# Patient Record
Sex: Female | Born: 1935 | Race: White | Hispanic: No | Marital: Married | Smoking: Never smoker
Health system: Southern US, Community
[De-identification: ages and names within clinical notes are randomized; demographics above are authoritative.]

## PROBLEM LIST (undated history)

## (undated) DIAGNOSIS — I10 Essential (primary) hypertension: Secondary | ICD-10-CM

## (undated) DIAGNOSIS — D649 Anemia, unspecified: Secondary | ICD-10-CM

## (undated) DIAGNOSIS — G7000 Myasthenia gravis without (acute) exacerbation: Secondary | ICD-10-CM

---

## 2007-12-13 ENCOUNTER — Emergency Department (HOSPITAL_BASED_OUTPATIENT_CLINIC_OR_DEPARTMENT_OTHER): Admission: EM | Admit: 2007-12-13 | Discharge: 2007-12-13 | Payer: Self-pay | Admitting: Emergency Medicine

## 2007-12-22 ENCOUNTER — Emergency Department (HOSPITAL_BASED_OUTPATIENT_CLINIC_OR_DEPARTMENT_OTHER): Admission: EM | Admit: 2007-12-22 | Discharge: 2007-12-22 | Payer: Self-pay | Admitting: Emergency Medicine

## 2008-01-24 ENCOUNTER — Emergency Department (HOSPITAL_BASED_OUTPATIENT_CLINIC_OR_DEPARTMENT_OTHER): Admission: EM | Admit: 2008-01-24 | Discharge: 2008-01-25 | Payer: Self-pay | Admitting: Emergency Medicine

## 2009-11-29 IMAGING — CT CT HEAD W/O CM
1 series · 16 of 30 positions shown, 20 images · non-contrast
Comparison: None

CLINICAL DATA: Dizziness.

CT HEAD WITHOUT CONTRAST
TECHNIQUE: Contiguous axial images were obtained from the base of
the skull through the vertex without contrast.

[Series 2: head 4.8 h37s · axial · 0.41mm/px · z∈[+1240,+1392]mm · 16 of 36 slices shown, 20 images]
[im 2/36  brain]
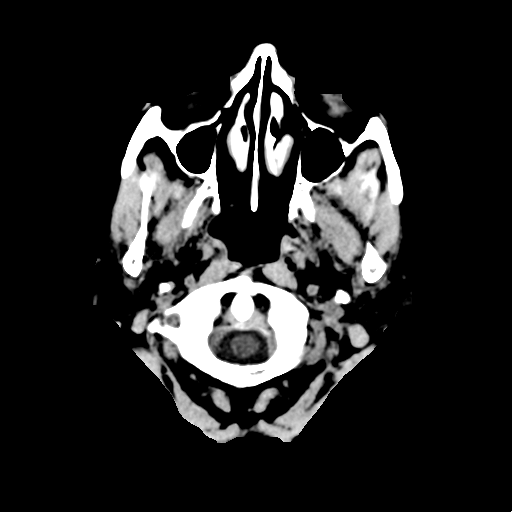
[im 2/36  bone]
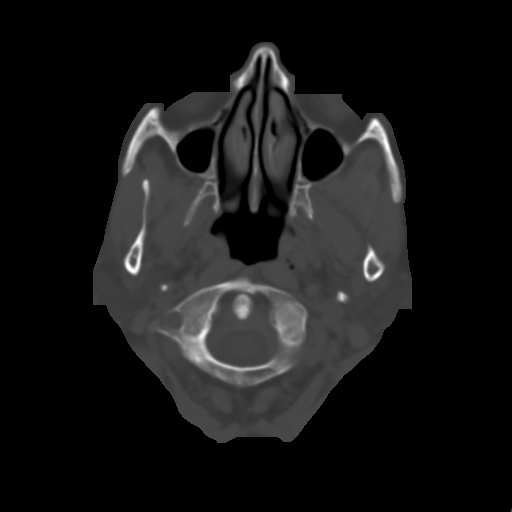
[im 4/36  brain]
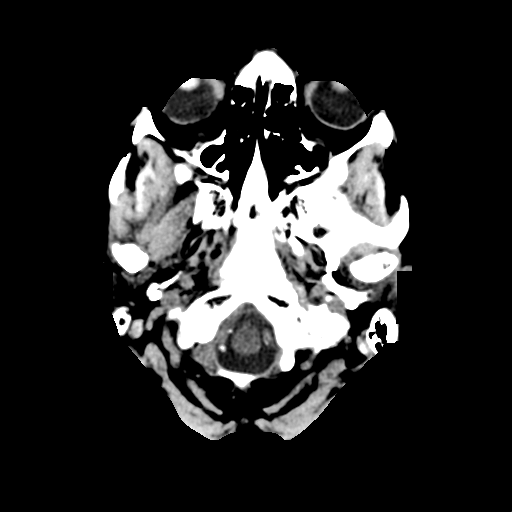
[im 7/36  brain]
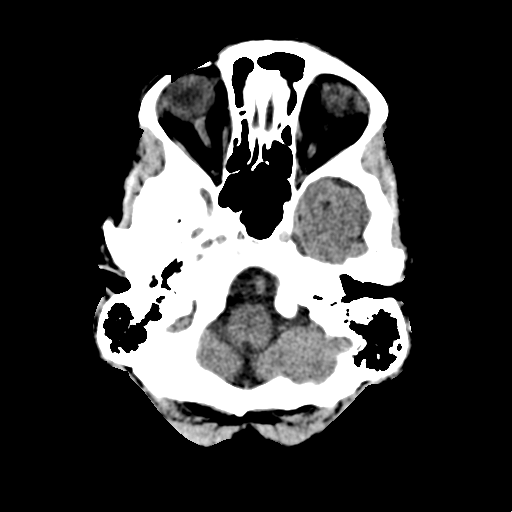
[im 9/36  brain]
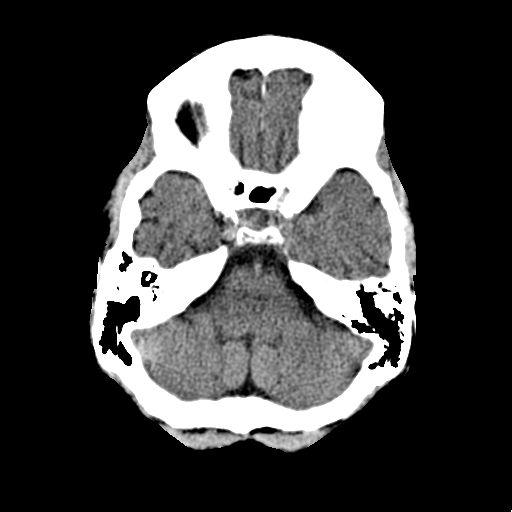
[im 10/36  brain]
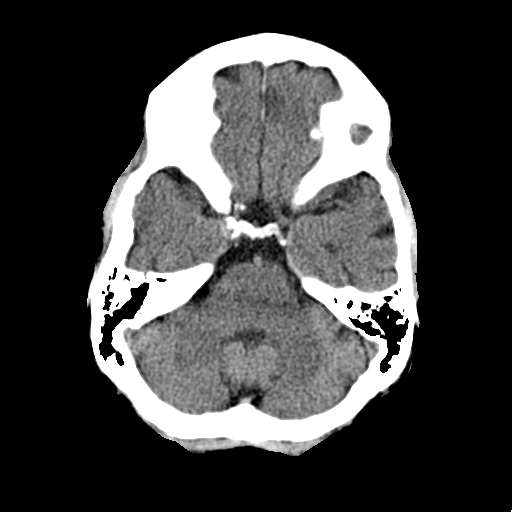
[im 10/36  bone]
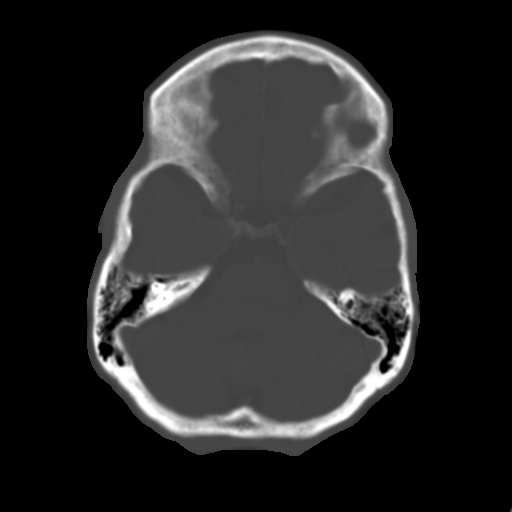
[im 13/36  brain]
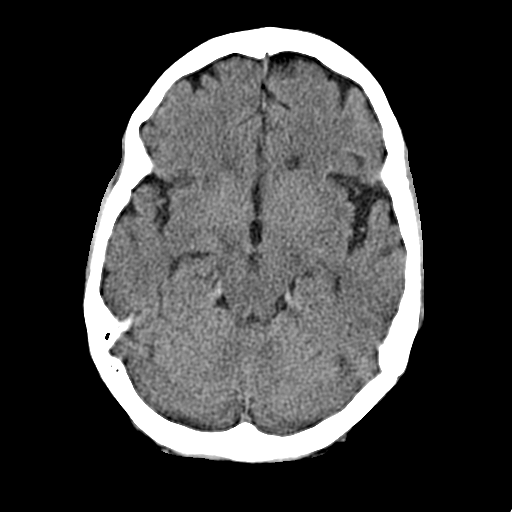
[im 15/36  brain]
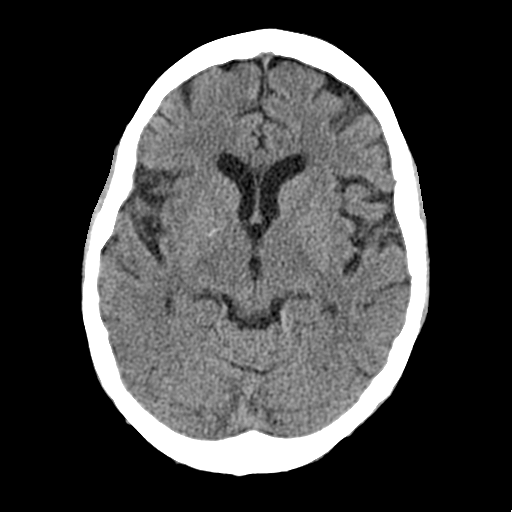
[im 17/36  brain]
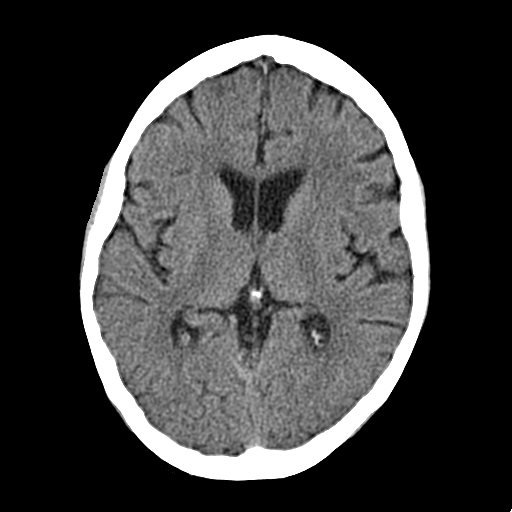
[im 19/36  brain]
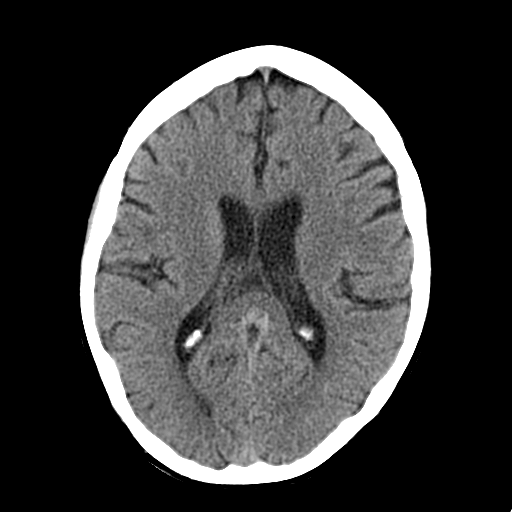
[im 19/36  bone]
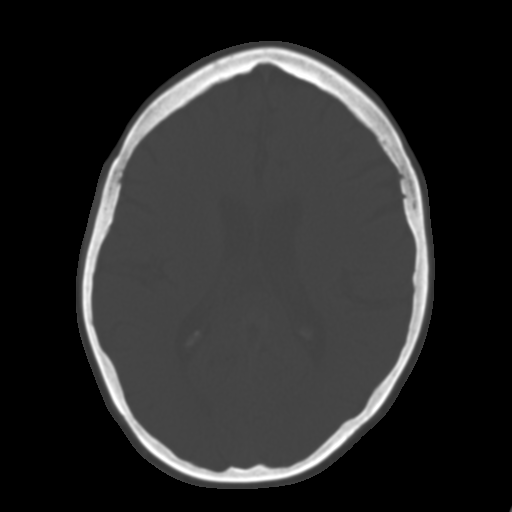
[im 21/36  brain]
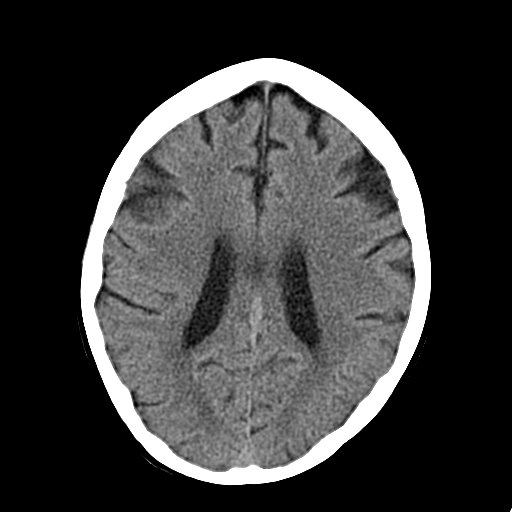
[im 23/36  brain]
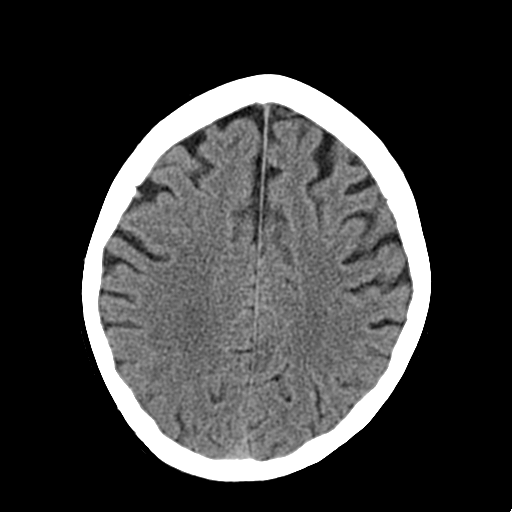
[im 26/36  brain]
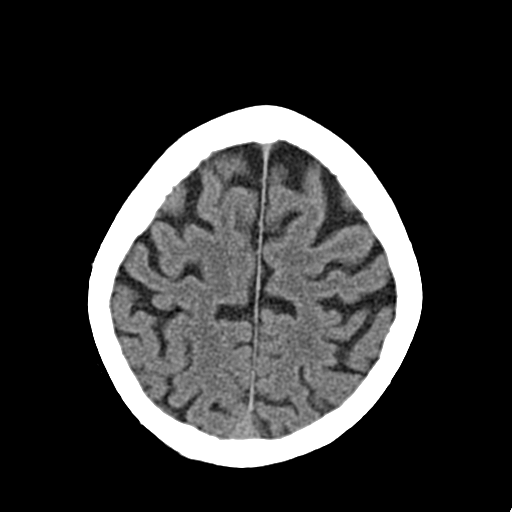
[im 27/36  brain]
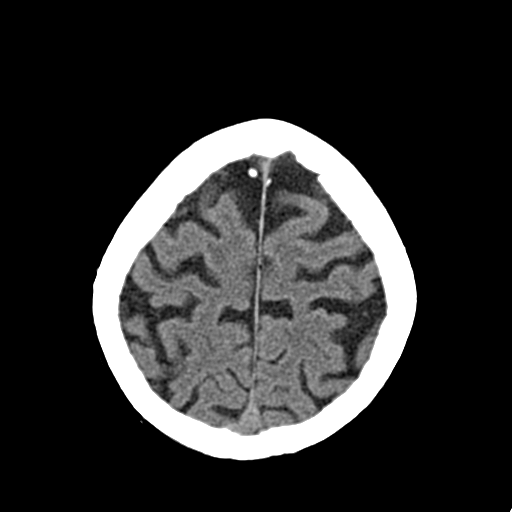
[im 27/36  bone]
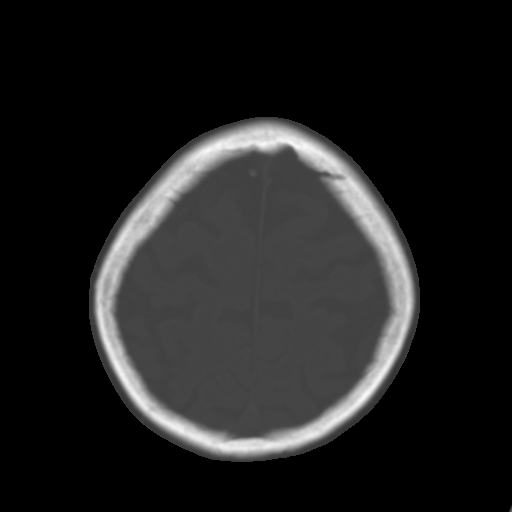
[im 29/36  brain]
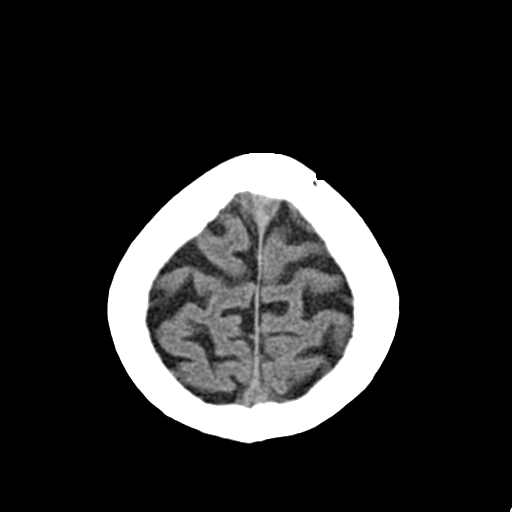
[im 32/36  brain]
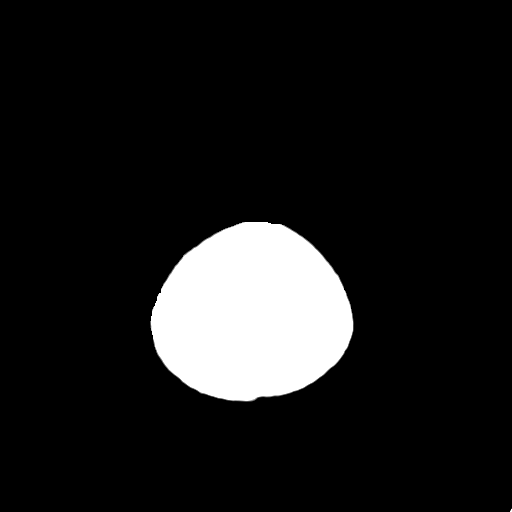
[im 34/36  brain]
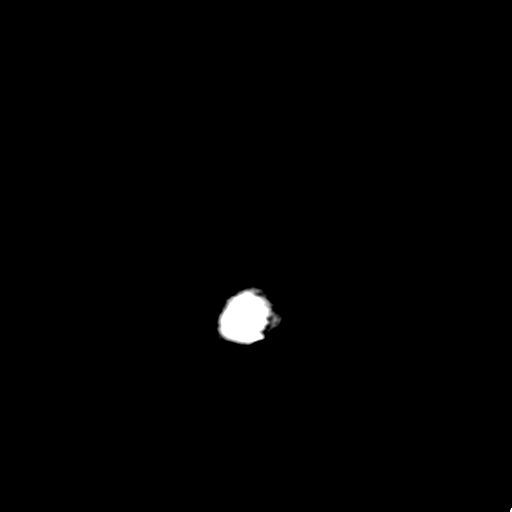

[16 of 30 positions shown; findings below may reference images not displayed]

FINDINGS: There is mild cerebral atrophy.  No evidence for acute
hemorrhage, mass lesion, midline shift, hydrocephalus or large
infarct.  No acute bony abnormalities.  The visualized paranasal
sinuses are clear. Subtle low density along the inferior aspect of
the right basal ganglia could represent an old insult and there may
be changes of chronic small vessel ischemic changes in the right
frontal subcortical white matter.
IMPRESSION: No acute intracranial abnormality.

Mild cerebral atrophy.

## 2011-01-30 LAB — CBC
Hemoglobin: 13.5
MCHC: 34.3
MCV: 95.8
RDW: 12.2

## 2011-01-30 LAB — GLUCOSE, CAPILLARY

## 2011-01-30 LAB — BASIC METABOLIC PANEL
BUN: 13
Chloride: 102
GFR calc Af Amer: >60
GFR calc non Af Amer: >60
Potassium: 3.6
Sodium: 134 — ABNORMAL LOW

## 2011-01-30 LAB — POCT CARDIAC MARKERS: Troponin i, poc: <0.05

## 2011-02-02 LAB — COMPREHENSIVE METABOLIC PANEL
ALT: 24
Alkaline Phosphatase: 57
BUN: 8
Chloride: 96
Glucose, Bld: 119 — ABNORMAL HIGH
Potassium: 3.9
Sodium: 131 — ABNORMAL LOW
Total Bilirubin: 0.6
Total Protein: 7

## 2011-02-02 LAB — URINALYSIS, ROUTINE W REFLEX MICROSCOPIC
Glucose, UA: NEGATIVE
Nitrite: NEGATIVE
Specific Gravity, Urine: 1.004 — ABNORMAL LOW
pH: 7.5

## 2011-02-02 LAB — DIFFERENTIAL
Basophils Absolute: 0
Basophils Relative: 0
Eosinophils Absolute: 0.1
Monocytes Absolute: 0.7
Monocytes Relative: 9
Neutro Abs: 5.1
Neutrophils Relative %: 65

## 2011-02-02 LAB — CBC
HCT: 39.3
Hemoglobin: 13.3
RBC: 4.11
WBC: 7.8

## 2011-02-02 LAB — APTT: aPTT: 40 — ABNORMAL HIGH

## 2011-02-02 LAB — POCT CARDIAC MARKERS: Troponin i, poc: <0.05

## 2017-12-29 ENCOUNTER — Emergency Department (HOSPITAL_BASED_OUTPATIENT_CLINIC_OR_DEPARTMENT_OTHER): Payer: Medicare Other

## 2017-12-29 ENCOUNTER — Other Ambulatory Visit: Payer: Self-pay

## 2017-12-29 ENCOUNTER — Encounter (HOSPITAL_BASED_OUTPATIENT_CLINIC_OR_DEPARTMENT_OTHER): Payer: Self-pay | Admitting: *Deleted

## 2017-12-29 ENCOUNTER — Emergency Department (HOSPITAL_BASED_OUTPATIENT_CLINIC_OR_DEPARTMENT_OTHER)
Admission: EM | Admit: 2017-12-29 | Discharge: 2017-12-29 | Disposition: A | Discharge status: Home / Self Care | Payer: Medicare Other | Attending: Emergency Medicine | Admitting: Emergency Medicine

## 2017-12-29 DIAGNOSIS — I10 Essential (primary) hypertension: Secondary | ICD-10-CM | POA: Diagnosis not present

## 2017-12-29 DIAGNOSIS — Z7982 Long term (current) use of aspirin: Secondary | ICD-10-CM | POA: Diagnosis not present

## 2017-12-29 DIAGNOSIS — Z79899 Other long term (current) drug therapy: Secondary | ICD-10-CM | POA: Diagnosis not present

## 2017-12-29 DIAGNOSIS — R509 Fever, unspecified: Secondary | ICD-10-CM | POA: Insufficient documentation

## 2017-12-29 DIAGNOSIS — R1030 Lower abdominal pain, unspecified: Secondary | ICD-10-CM | POA: Diagnosis present

## 2017-12-29 DIAGNOSIS — R140 Abdominal distension (gaseous): Secondary | ICD-10-CM | POA: Diagnosis not present

## 2017-12-29 DIAGNOSIS — K5792 Diverticulitis of intestine, part unspecified, without perforation or abscess without bleeding: Secondary | ICD-10-CM | POA: Diagnosis not present

## 2017-12-29 HISTORY — DX: Anemia, unspecified: D64.9

## 2017-12-29 HISTORY — DX: Essential (primary) hypertension: I10

## 2017-12-29 HISTORY — DX: Myasthenia gravis without (acute) exacerbation: G70.00

## 2017-12-29 LAB — CBC WITH DIFFERENTIAL/PLATELET
BASOS ABS: 0 10*3/uL (ref 0.0–0.1)
BASOS PCT: 0 %
Eosinophils Absolute: 0 10*3/uL (ref 0.0–0.7)
Eosinophils Relative: 0 %
HCT: 36.9 % (ref 36.0–46.0)
HEMOGLOBIN: 12.3 g/dL (ref 12.0–15.0)
LYMPHS PCT: 5 %
Lymphs Abs: 0.6 10*3/uL — ABNORMAL LOW (ref 0.7–4.0)
MCH: 30.1 pg (ref 26.0–34.0)
MCHC: 33.3 g/dL (ref 30.0–36.0)
MCV: 90.4 fL (ref 78.0–100.0)
Monocytes Absolute: 0.8 10*3/uL (ref 0.1–1.0)
Monocytes Relative: 8 %
NEUTROS PCT: 87 %
Neutro Abs: 9.3 10*3/uL — ABNORMAL HIGH (ref 1.7–7.7)
Platelets: 337 10*3/uL (ref 150–400)
RBC: 4.08 MIL/uL (ref 3.87–5.11)
RDW: 13.6 % (ref 11.5–15.5)
WBC: 10.8 10*3/uL — ABNORMAL HIGH (ref 4.0–10.5)

## 2017-12-29 LAB — COMPREHENSIVE METABOLIC PANEL
ALBUMIN: 3.4 g/dL — AB (ref 3.5–5.0)
ALK PHOS: 72 U/L (ref 38–126)
ALT: 16 U/L (ref 0–44)
AST: 29 U/L (ref 15–41)
Anion gap: 10 (ref 5–15)
BUN: 20 mg/dL (ref 8–23)
CALCIUM: 9 mg/dL (ref 8.9–10.3)
CO2: 25 mmol/L (ref 22–32)
CREATININE: 0.91 mg/dL (ref 0.44–1.00)
Chloride: 95 mmol/L — ABNORMAL LOW (ref 98–111)
GFR calc Af Amer: >60 mL/min (ref >60)
GFR calc non Af Amer: 54 mL/min — ABNORMAL LOW (ref >60)
GLUCOSE: 138 mg/dL — AB (ref 70–99)
POTASSIUM: 4.4 mmol/L (ref 3.5–5.1)
Sodium: 130 mmol/L — ABNORMAL LOW (ref 135–145)
TOTAL PROTEIN: 7.4 g/dL (ref 6.5–8.1)
Total Bilirubin: 0.6 mg/dL (ref 0.3–1.2)

## 2017-12-29 LAB — URINALYSIS, ROUTINE W REFLEX MICROSCOPIC
BILIRUBIN URINE: NEGATIVE
GLUCOSE, UA: NEGATIVE mg/dL
KETONES UR: NEGATIVE mg/dL
LEUKOCYTES UA: NEGATIVE
Nitrite: NEGATIVE
PH: 6 (ref 5.0–8.0)
Protein, ur: 30 mg/dL — AB
Specific Gravity, Urine: 1.015 (ref 1.005–1.030)

## 2017-12-29 LAB — URINALYSIS, MICROSCOPIC (REFLEX)

## 2017-12-29 LAB — LIPASE, BLOOD: Lipase: 31 U/L (ref 11–51)

## 2017-12-29 MED ORDER — ACETAMINOPHEN 500 MG PO TABS
1000.0000 mg | ORAL_TABLET | Freq: Once | ORAL | Status: AC
  Administered 2017-12-29: 1000 mg via ORAL
  Filled 2017-12-29: qty 2

## 2017-12-29 MED ORDER — AMOXICILLIN-POT CLAVULANATE 875-125 MG PO TABS: 1.0000 | ORAL_TABLET | Freq: Two times a day (BID) | ORAL | 0 refills | Status: AC

## 2017-12-29 MED ORDER — AMOXICILLIN-POT CLAVULANATE 875-125 MG PO TABS
1.0000 | ORAL_TABLET | Freq: Once | ORAL | Status: AC
  Administered 2017-12-29: 1 via ORAL
  Filled 2017-12-29: qty 1

## 2017-12-29 MED ORDER — SODIUM CHLORIDE 0.9 % IV BOLUS
1000.0000 mL | Freq: Once | INTRAVENOUS | Status: AC
  Administered 2017-12-29: 1000 mL via INTRAVENOUS

## 2017-12-29 NOTE — ED Triage Notes (Addendum)
Pt brought in by [facility] from [facility] c/o abd pain x 2 weeks , Dx UTI x 1 week ago on 2nd abx

## 2017-12-29 NOTE — Discharge Instructions (Addendum)
Follow up with your PCP.  Take your medications as prescribed.

## 2017-12-29 NOTE — ED Provider Notes (Signed)
[facility] EMERGENCY DEPARTMENT Provider Note   CSN: [id number] Arrival date & time: 12/29/17  1427     History   Chief Complaint Chief Complaint  Patient presents with  . Abdominal Pain    HPI Stacey Shepherd is a 84 y.o. female.  84 yo F with a chief complaint of abdominal pain.  This is a chronic issue for her.  She states that she had old age.  She is been having this pain off and on.  Denies any significant worsening.  She has been having diarrhea that has been chronic as well.  Normally has a couple loose bowel movements in the morning and then resolves.  She denies cough or congestion denies dysuria increased frequency or hesitancy denies flank pain.  She denies fevers or chills.  She was recently seen by her family physician for her diarrhea and was found to have a urinary tract infection.  She was started on bactrim and then transition to macrobid.  The history is provided by the patient.  Abdominal Pain   This is a chronic problem. The current episode started more than 1 week ago. The problem occurs constantly. The problem has not changed since onset.The pain is located in the generalized abdominal region. The quality of the pain is sharp and shooting. The pain is at a severity of 6/10. The pain is moderate. Associated symptoms include diarrhea. Pertinent negatives include fever, nausea, vomiting, dysuria, headaches, arthralgias and myalgias. Nothing aggravates the symptoms. Nothing relieves the symptoms.    Past Medical History:  Diagnosis Date  . Anemia   . Hypertension   . Myasthenia gravis (HCC)     There are no active problems to display for this patient.   History reviewed. No pertinent surgical history.   OB History   None      Home Medications    Prior to Admission medications   Medication Sig Start Date End Date Taking? Authorizing Provider  aspirin EC 81 MG tablet Take by mouth. 03/06/08  Yes [provider]  Cholecalciferol  (VITAMIN D-1000 MAX ST) 1000 units tablet Take by mouth. 02/11/08  Yes [provider]  diltiazem (CARDIZEM CD) 180 MG 24 hr capsule TAKE 1 CAPSULE BY MOUTH TWICE DAILY 11/08/17  Yes [provider]  lisinopril (PRINIVIL,ZESTRIL) 10 MG tablet Take by mouth. 09/21/17 06/16/20 Yes [provider]  potassium chloride SA (K-DUR,KLOR-CON) 20 MEQ tablet TAKE 1 TABLET BY MOUTH DAILY GENERIC EQUIVALENT FOR KLOR-CON 09/21/17  Yes [provider]  pyridostigmine (MESTINON) 60 MG tablet One half to one whole tablet every 6 hours as needed for double vision. 10/13/16  Yes [provider]  CARTIA XT 300 MG 24 hr capsule TK ONE C PO D. GEQ DILTIAZEM CD 10/25/17   [provider]  diltiazem (CARDIZEM CD) 180 MG 24 hr capsule TK ONE C PO BID 11/09/17   [provider]  Ferrous Sulfate 134 MG TABS Take by mouth.    [provider]  folic acid (FOLVITE) 400 MCG tablet Take by mouth.    [provider]  furosemide (LASIX) 20 MG tablet TK 1 T PO D 10/27/17   [provider]  nitrofurantoin, macrocrystal-monohydrate, (MACROBID) 100 MG capsule TAKE ONE CAPSULE BY MOUTH TWICE A DAY FOR 10 DAYS 12/22/17   [provider]  potassium chloride SA (K-DUR,KLOR-CON) 20 MEQ tablet TK 1 T PO D. GEF KLOR-CON 10/26/17   [provider]  sulfamethoxazole-trimethoprim (BACTRIM DS,SEPTRA DS) 800-160 MG tablet Take  1 tablet by mouth 2 (two) times daily. for 10 days 12/20/17   [provider]    Family History History reviewed. No pertinent family history.  Social History Social History   Tobacco Use  . Smoking status: Never Smoker  . Smokeless tobacco: Never Used  Substance Use Topics  . Alcohol use: Not Currently  . Drug use: Not Currently     Allergies   Ciprofloxacin   Review of Systems Review of Systems  Constitutional: Negative for chills and fever.  HENT: Negative for congestion and rhinorrhea.   Eyes:  Negative for redness and visual disturbance.  Respiratory: Negative for shortness of breath and wheezing.   Cardiovascular: Negative for chest pain and palpitations.  Gastrointestinal: Positive for abdominal distention, abdominal pain and diarrhea. Negative for nausea and vomiting.  Genitourinary: Negative for dysuria and urgency.  Musculoskeletal: Negative for arthralgias and myalgias.  Skin: Negative for pallor and wound.  Neurological: Negative for dizziness and headaches.     Physical Exam Updated Vital Signs BP (!) 158/83 (BP Location: Right Arm)   Pulse 88   Temp (!) 100.4 F (38 C) (Oral)   Resp 18   Ht 5\' 5"  (1.651 m)   Wt 47.6 kg   SpO2 97%   BMI 17.47 kg/m   Physical Exam  Constitutional: She is oriented to person, place, and time. She appears well-developed and well-nourished. No distress.  HENT:  Head: Normocephalic and atraumatic.  Eyes: Pupils are equal, round, and reactive to light. EOM are normal.  Neck: Normal range of motion. Neck supple.  Cardiovascular: Normal rate and regular rhythm. Exam reveals no gallop and no friction rub.  No murmur heard. Pulmonary/Chest: Effort normal. She has no wheezes. She has no rales.  Abdominal: Soft. She exhibits distension. She exhibits no mass. There is tenderness (diffuse lower). There is no guarding, no tenderness at McBurney's point and negative Murphy's sign.  Musculoskeletal: She exhibits no edema or tenderness.  Neurological: She is alert and oriented to person, place, and time.  Skin: Skin is warm and dry. She is not diaphoretic.  Psychiatric: She has a normal mood and affect. Her behavior is normal.  Nursing note and vitals reviewed.    ED Treatments / Results  Labs (all labs ordered are listed, but only abnormal results are displayed) Labs Reviewed  CBC WITH DIFFERENTIAL/PLATELET - Abnormal; Notable for the following components:      Result Value   WBC 10.8 (*)    Neutro Abs 9.3 (*)    Lymphs Abs 0.6 (*)     All other components within normal limits  COMPREHENSIVE METABOLIC PANEL - Abnormal; Notable for the following components:   Sodium 130 (*)    Chloride 95 (*)    Glucose, Bld 138 (*)    Albumin 3.4 (*)    GFR calc non Af Amer 54 (*)    All other components within normal limits  URINE CULTURE  GASTROINTESTINAL PANEL BY PCR, STOOL (REPLACES STOOL CULTURE)  LIPASE, BLOOD  URINALYSIS, ROUTINE W REFLEX MICROSCOPIC    EKG None  Radiology Dg Chest 2 View  Result Date: 12/29/2017 CLINICAL DATA:  84 year old female with a history abdominal pain EXAM: CHEST - 2 VIEW COMPARISON:  01/25/2008 FINDINGS: Cardiomediastinal silhouette unchanged in size and contour. No evidence of central vascular congestion. No pneumothorax or pleural effusion. Coarsened interstitial markings, similar to prior with no confluent airspace disease. Stigmata of emphysema, with increased retrosternal airspace, flattened hemidiaphragms, increased AP diameter, and hyperinflation on the  AP view. Since the prior plain film there has been placement of left chest wall cardiac pacing device with 2 leads in place. No displaced fracture. IMPRESSION: Chronic lung changes and evidence of emphysema without evidence of acute cardiopulmonary disease. Left chest wall cardiac pacing device. Electronically Signed   By: Gilmer Mor D.O.   On: 12/29/2017 15:32   Ct Renal Stone Study  Result Date: 12/29/2017 CLINICAL DATA:  Acute flank pain. EXAM: CT ABDOMEN AND PELVIS WITHOUT CONTRAST TECHNIQUE: Multidetector CT imaging of the abdomen and pelvis was performed following the standard protocol without IV contrast. COMPARISON:  None. FINDINGS: Lower chest: No acute abnormality. Hepatobiliary: No focal liver abnormality is seen. No gallstones, gallbladder wall thickening, or biliary dilatation. Pancreas: Unremarkable. No pancreatic ductal dilatation or surrounding inflammatory changes. Spleen: Normal in size without focal abnormality.  Adrenals/Urinary Tract: Adrenal glands appear normal. Left renal atrophy is noted. No hydronephrosis or renal obstruction is noted. No renal or ureteral calculi are noted. Urinary bladder is unremarkable. Stomach/Bowel: The stomach appears normal. There is no evidence of bowel obstruction. The appendix is not clearly visualized. Extensive sigmoid diverticulosis is noted with surrounding inflammation concerning for sigmoid diverticulitis. Vascular/Lymphatic: Aortic atherosclerosis. No enlarged abdominal or pelvic lymph nodes. Reproductive: Uterus is not well visualized in the patient is either status post hysterectomy or as significant renal atrophy. No significant adnexal abnormality is noted. Other: No abdominal wall hernia or abnormality. No abdominopelvic ascites. Musculoskeletal: No acute or significant osseous findings. IMPRESSION: Left renal atrophy. No hydronephrosis or renal obstruction is noted. Extensive sigmoid diverticulosis is noted with surrounding inflammation concerning for acute sigmoid diverticulitis. Aortic Atherosclerosis (ICD10-I70.0). Electronically Signed   By: Lupita Raider, M.D.   On: 12/29/2017 15:43    Procedures Procedures (including critical care time)  Medications Ordered in ED Medications  sodium chloride 0.9 % bolus 1,000 mL (1,000 mLs Intravenous New Bag/Given 12/29/17 1449)  acetaminophen (TYLENOL) tablet 1,000 mg (1,000 mg Oral Given 12/29/17 1449)     Initial Impression / Assessment and Plan / ED Course  I have reviewed the triage vital signs and the nursing notes.  Pertinent labs & imaging results that were available during my care of the patient were reviewed by me and considered in my medical decision making (see chart for details).     84 yo F chief complaints of abdominal pain.  This appears to be a chronic issue for her.  She is mildly distended on my exam and has some abdominal tenderness.  I will obtain a CT scan.  Obtain a UA chest x-ray.  Patient was  febrile here to 100.4.  Not febrile at home.  CXR negative for infection as viewed by me.  If UA is infected would change to keflex to treat pyelo.  Patient well appearing and non toxic feel likely ok for home treatment.  Awaiting CT results.  Discussed with Dr. Juleen China, please see his note for further details of care.   The patients results and plan were reviewed and discussed.   Any x-rays performed were independently reviewed by myself.   Differential diagnosis were considered with the presenting HPI.  Medications  sodium chloride 0.9 % bolus 1,000 mL (1,000 mLs Intravenous New Bag/Given 12/29/17 1449)  acetaminophen (TYLENOL) tablet 1,000 mg (1,000 mg Oral Given 12/29/17 1449)    Vitals:   12/29/17 1431 12/29/17 1433  BP: (!) 158/83   Pulse: 88   Resp: 18   Temp: (!) 100.4 F (38 C)   TempSrc: Oral  SpO2: 97%   Weight:  47.6 kg  Height:  5\' 5"  (1.651 m)    Final diagnoses:  Fever in adult  Lower abdominal pain      Final Clinical Impressions(s) / ED Diagnoses   Final diagnoses:  Fever in adult  Lower abdominal pain    ED Discharge Orders    None       Melene Plan, DO 12/29/17 1547

## 2017-12-29 NOTE — ED Notes (Signed)
ED Provider at bedside. 

## 2017-12-29 NOTE — ED Notes (Signed)
Patient transported to CT 

## 2017-12-29 NOTE — ED Provider Notes (Signed)
Assumed care at change of shift with imaging pending.  CT is consistent with diverticulitis without perforation or abscess.  Patient was reassessed by myself.  I feel she is appropriate for outpatient treatment.  She has an allergy to ciprofloxacin.  We will treat her with Augmentin.   Raeford RazorKohut, Demarrion Meiklejohn, MD 12/29/17 1630

## 2017-12-30 LAB — URINE CULTURE: Culture: NO GROWTH

## 2020-07-02 DEATH — deceased
